# Patient Record
Sex: Male | Born: 1970 | Race: White | Hispanic: No | Marital: Married | Smoking: Never smoker
Health system: Southern US, Community
[De-identification: ages and names within clinical notes are randomized; demographics above are authoritative.]

## PROBLEM LIST (undated history)

## (undated) DIAGNOSIS — T7840XA Allergy, unspecified, initial encounter: Secondary | ICD-10-CM

## (undated) HISTORY — PX: VASECTOMY: SHX75

## (undated) HISTORY — DX: Allergy, unspecified, initial encounter: T78.40XA

---

## 2006-02-04 ENCOUNTER — Ambulatory Visit: Payer: Self-pay | Admitting: Cardiology

## 2009-09-10 ENCOUNTER — Emergency Department (HOSPITAL_COMMUNITY): Admission: EM | Admit: 2009-09-10 | Discharge: 2009-09-10 | Payer: Self-pay | Admitting: Emergency Medicine

## 2009-09-12 ENCOUNTER — Encounter: Payer: Self-pay | Admitting: Cardiology

## 2009-09-21 ENCOUNTER — Encounter: Payer: Self-pay | Admitting: Cardiology

## 2009-10-10 DIAGNOSIS — R079 Chest pain, unspecified: Secondary | ICD-10-CM | POA: Insufficient documentation

## 2009-10-15 ENCOUNTER — Ambulatory Visit: Payer: Self-pay | Admitting: Cardiology

## 2009-10-15 ENCOUNTER — Ambulatory Visit: Payer: Self-pay

## 2009-10-18 ENCOUNTER — Telehealth: Payer: Self-pay | Admitting: Cardiology

## 2009-10-18 LAB — CONVERTED CEMR LAB
Eosinophils Relative: 1.3 % (ref 0.0–5.0)
Hemoglobin: 14.7 g/dL (ref 13.0–17.0)
Lymphocytes Relative: 23 % (ref 12.0–46.0)
MCV: 93.1 fL (ref 78.0–100.0)
Monocytes Absolute: 0.6 10*3/uL (ref 0.1–1.0)
Monocytes Relative: 8.3 % (ref 3.0–12.0)
Neutro Abs: 5 10*3/uL (ref 1.4–7.7)
Platelets: 239 10*3/uL (ref 150.0–400.0)
WBC: 7.5 10*3/uL (ref 4.5–10.5)

## 2010-06-24 NOTE — Letter (Signed)
Summary: [employer] Office Note  [employer] Office Note   Imported By: Roderic Ovens 11/05/2009 16:38:30  _____________________________________________________________________  External Attachment:    Type:   Image     Comment:   External Document

## 2010-06-24 NOTE — Assessment & Plan Note (Signed)
Summary: NP6/ CHESTPAIN/ PT HAS GOLDEN RULE/ GD    Other Orders: TLB-CBC Platelet - w/Differential (85025-CBCD) TLB-TSH (Thyroid Stimulating Hormone) (84443-TSH)

## 2010-06-24 NOTE — Progress Notes (Signed)
Summary: Lab results  Phone Note Call from Patient Call back at [phone]   Caller: Patient Summary of Call: lab results Initial call taken by: Judie Grieve,  Oct 18, 2009 11:26 AM  Follow-up for Phone Call        Phone Call Completed Follow-up by: Scherrie Bateman, LPN,  Oct 18, 2009 11:59 AM     Appended Document: Lab results agree with diet and exercise.

## 2010-08-12 LAB — BASIC METABOLIC PANEL
CO2: 26 mEq/L (ref 19–32)
Calcium: 9.3 mg/dL (ref 8.4–10.5)
Chloride: 100 mEq/L (ref 96–112)
Creatinine, Ser: 1.02 mg/dL (ref 0.4–1.5)
GFR calc non Af Amer: >60 mL/min (ref >60)
Potassium: 3.6 mEq/L (ref 3.5–5.1)
Sodium: 135 mEq/L (ref 135–145)

## 2010-08-12 LAB — DIFFERENTIAL
Basophils Absolute: 0 K/uL (ref 0.0–0.1)
Basophils Relative: 0 % (ref 0–1)
Eosinophils Absolute: 0.1 K/uL (ref 0.0–0.7)
Eosinophils Relative: 1 % (ref 0–5)
Lymphocytes Relative: 15 % (ref 12–46)
Lymphs Abs: 1.4 K/uL (ref 0.7–4.0)
Monocytes Absolute: 0.6 K/uL (ref 0.1–1.0)
Monocytes Relative: 7 % (ref 3–12)
Neutro Abs: 7 K/uL (ref 1.7–7.7)
Neutrophils Relative %: 77 % (ref 43–77)

## 2010-08-12 LAB — POCT CARDIAC MARKERS
CKMB, poc: 1.1 ng/mL (ref 1.0–8.0)
Myoglobin, poc: 86.2 ng/mL (ref 12–200)
Troponin i, poc: <0.05 ng/mL (ref 0.00–0.09)

## 2010-08-12 LAB — CBC
MCHC: 35.4 g/dL (ref 30.0–36.0)
MCV: 88.8 fL (ref 78.0–100.0)
RBC: 4.59 MIL/uL (ref 4.22–5.81)
WBC: 9 10*3/uL (ref 4.0–10.5)

## 2011-09-17 IMAGING — CR DG CHEST 1V PORT
1 series · 1 of 1 positions shown · non-contrast
Comparison: None.

CLINICAL DATA: Left chest pain.  Shortness of breath.

PORTABLE CHEST - 1 VIEW

[view not recorded]
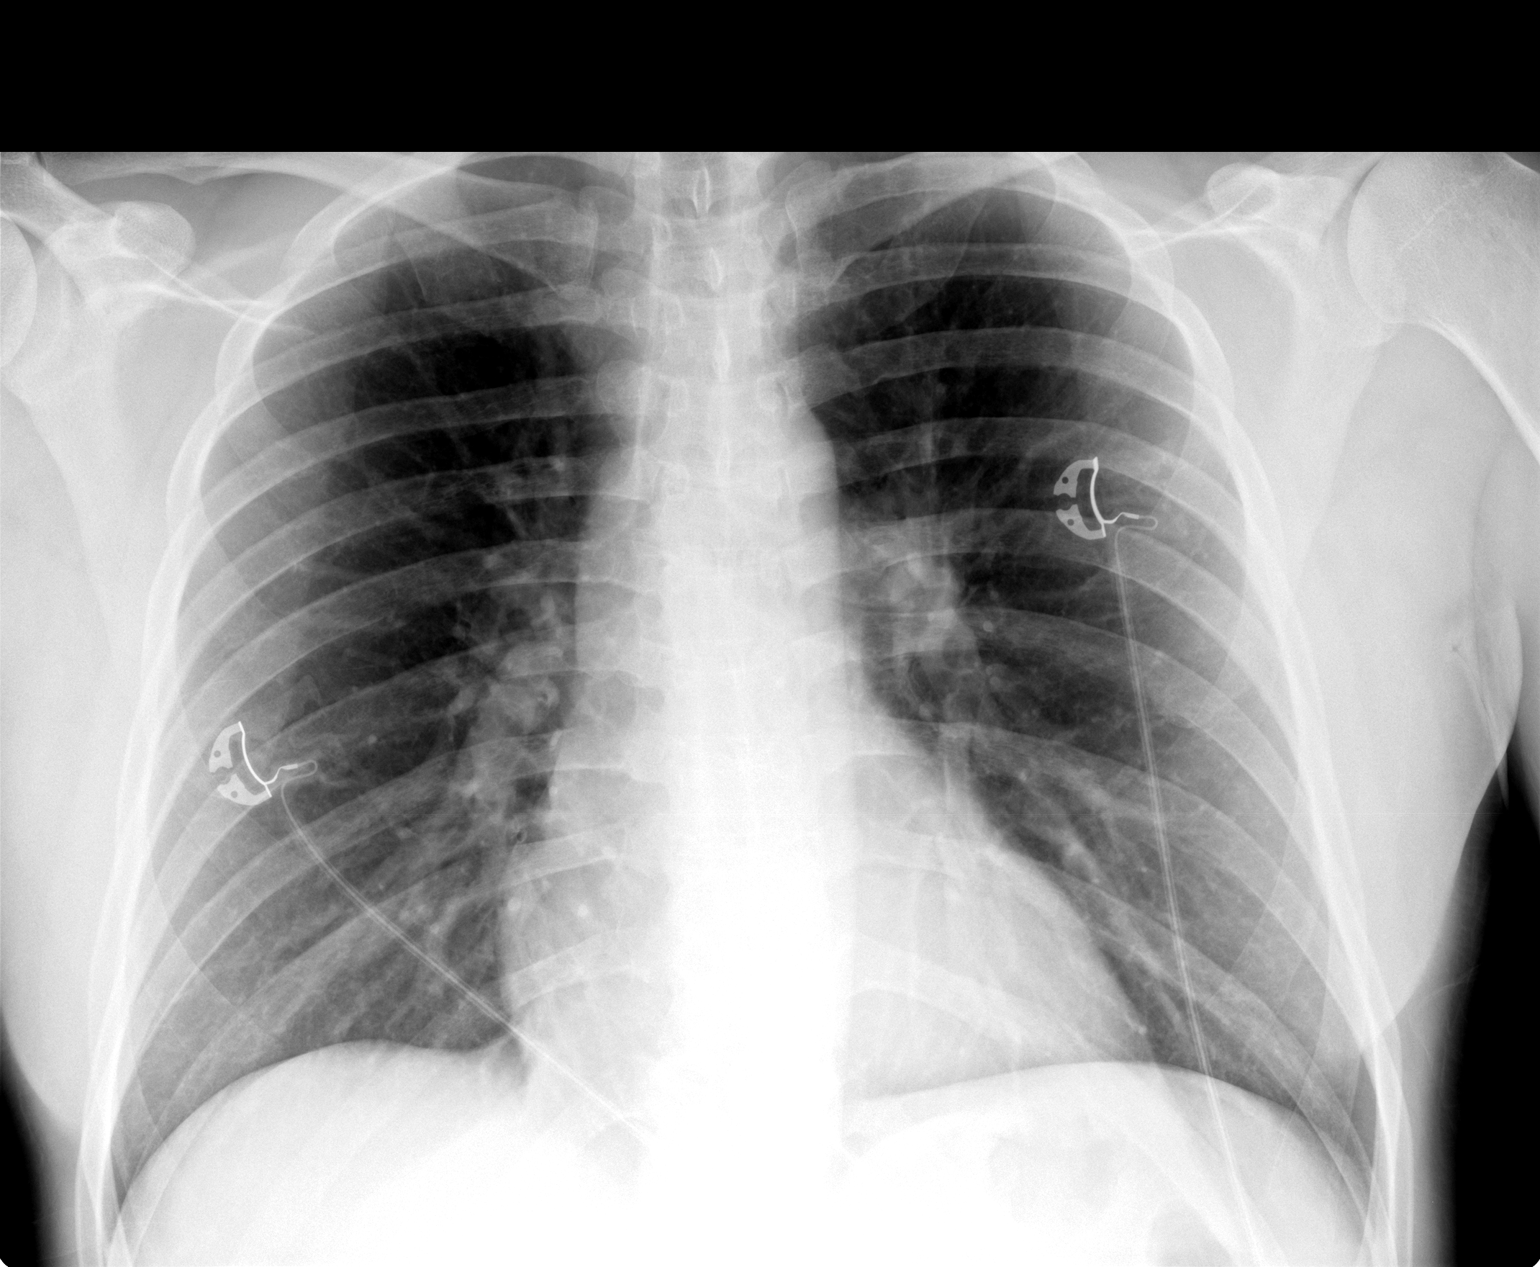

[1 of 1 positions shown; findings below may reference images not displayed]

FINDINGS: Cardiac and mediastinal contours appear normal.

The lungs appear clear.

No pleural effusion is identified.
IMPRESSION: No significant abnormality identified.

## 2014-04-28 ENCOUNTER — Ambulatory Visit (INDEPENDENT_AMBULATORY_CARE_PROVIDER_SITE_OTHER): Payer: BC Managed Care – PPO | Admitting: Family Medicine

## 2014-04-28 VITALS — BP 126/68 | HR 49 | Temp 98.2°F | Resp 19 | Ht 69.0 in | Wt 210.0 lb

## 2014-04-28 DIAGNOSIS — M545 Low back pain: Secondary | ICD-10-CM

## 2014-04-28 DIAGNOSIS — S39011A Strain of muscle, fascia and tendon of abdomen, initial encounter: Secondary | ICD-10-CM

## 2014-04-28 LAB — POCT UA - MICROSCOPIC ONLY
BACTERIA, U MICROSCOPIC: NEGATIVE
Casts, Ur, LPF, POC: NEGATIVE
Crystals, Ur, HPF, POC: NEGATIVE
Mucus, UA: NEGATIVE
RBC, urine, microscopic: NEGATIVE
Yeast, UA: NEGATIVE

## 2014-04-28 LAB — POCT URINALYSIS DIPSTICK
BILIRUBIN UA: NEGATIVE
Glucose, UA: 100
KETONES UA: NEGATIVE
LEUKOCYTES UA: NEGATIVE
Nitrite, UA: POSITIVE
PH UA: 7.5
Protein, UA: NEGATIVE
RBC UA: NEGATIVE
SPEC GRAV UA: 1.015
UROBILINOGEN UA: 1

## 2014-04-28 NOTE — Progress Notes (Addendum)
Subjective:    Patient ID: Johnny Love, male    DOB: 09-01-1970, 43 y.o.   MRN: [id number]  HPI Chief Complaint  Patient presents with   Flank Pain    side pain during the day, radiated to genital area   Back Pain    when sleeping   This chart was scribed for Nilda SimmerKristi Smith, MD by Andrew Auaven Small, ED Scribe. This patient was seen in room 3 and the patient's care was started at 11:16 AM.  HPI Comments: Johnny BucksRichard J Fitzpatrick is a 43 y.o. male who presents to the [facility] complaining of worsening flank pain that began 2 weeks ago. Pt reports worsening pain with sleep and movement, such as turning and bending, that radiates to genitals and mid back without numbness and tingling to genital or extremities .He rates pain in the morning 7/10, 4-5/10 throughout the day and a very mild 1-2/10 at this time.  He reports for the last 2 nights pain has woken him up. Pt has taken AZO with improvement in pain.  Pt has taken 3 ibuprofen at once for the past 2 days. Pt  recently fell at the beginning of this week, missing a step when taking his daughter to the bus two weeks ago. Pt denies falling hard.  He denies chills, sweats, dysuria, urgency, frequency, hematuria. Pt denies h/o prostate infection. He denies frequent medication use. He denies hx of kidney stone.  Married without new sexual partners.  Past Medical History  Diagnosis Date   Allergy    Allergies  Allergen Reactions   Sulfa Antibiotics     Sulfa drugs   Prior to Admission medications   Not on File   Review of Systems  Constitutional: Negative for fever, chills and diaphoresis.  Respiratory: Negative for cough and shortness of breath.   Gastrointestinal: Positive for nausea. Negative for vomiting, abdominal pain, diarrhea, constipation, blood in stool, anal bleeding and rectal pain.  Genitourinary: Positive for flank pain and testicular pain. Negative for dysuria, urgency, frequency, hematuria, decreased urine  volume, discharge, penile swelling, scrotal swelling, difficulty urinating and penile pain.  Musculoskeletal: Positive for myalgias and back pain. Negative for gait problem.  Skin: Negative for rash.  Neurological: Negative for weakness and numbness.   Objective:   Physical Exam  Constitutional: He is oriented to person, place, and time. He appears well-developed and well-nourished. No distress.  HENT:  Head: Normocephalic and atraumatic.  Eyes: Conjunctivae and EOM are normal.  Neck: Neck supple.  Cardiovascular: Normal rate, regular rhythm and normal heart sounds.   No murmur heard. Pulmonary/Chest: Effort normal and breath sounds normal. No respiratory distress. He has no wheezes. He has no rales.  Abdominal: Soft. Bowel sounds are normal. He exhibits no distension. There is no tenderness. There is no rebound and no guarding. Hernia confirmed negative in the right inguinal area and confirmed negative in the left inguinal area.  Genitourinary: Penis normal. Right testis shows no mass, no swelling and no tenderness. Right testis is descended. Left testis shows tenderness. Left testis shows no mass and no swelling. Circumcised.  Mild tenderness to epididymis of left testicle right is normal.   Musculoskeletal: Normal range of motion.       Lumbar back: He exhibits pain. He exhibits normal range of motion, no tenderness, no bony tenderness, no swelling, no edema, no spasm and normal pulse.  Full ROM of lumbar spine with minimal pain reproduced. Straight leg negative. Heel toe normal. Marching normal.  5/5 strength   Lymphadenopathy:       Right: No inguinal adenopathy present.       Left: No inguinal adenopathy present.  Neurological: He is alert and oriented to person, place, and time.  Skin: Skin is warm and dry. No rash noted. He is not diaphoretic.  Psychiatric: He has a normal mood and affect. His behavior is normal.  Nursing note and vitals reviewed.  Results for orders placed or  performed in visit on 04/28/14  POCT UA - Microscopic Only  Result Value Ref Range   WBC, Ur, HPF, POC 0-2    RBC, urine, microscopic neg    Bacteria, U Microscopic neg    Mucus, UA neg    Epithelial cells, urine per micros 1-3    Crystals, Ur, HPF, POC neg    Casts, Ur, LPF, POC neg    Yeast, UA neg   POCT urinalysis dipstick  Result Value Ref Range   Color, UA orange    Clarity, UA clear    Glucose, UA 100    Bilirubin, UA neg    Ketones, UA neg    Spec Grav, UA 1.015    Blood, UA neg    pH, UA 7.5    Protein, UA neg    Urobilinogen, UA 1.0    Nitrite, UA positive    Leukocytes, UA Negative         Assessment & Plan:  Low back pain without sciatica, unspecified back pain laterality - Plan: POCT UA - Microscopic Only, POCT urinalysis dipstick, Urine culture   1. Lower back pain L:  New.  With radiation into L testicle.  Pain reproduced with change in position.  Some mild related urinary symptoms thus will send urine culture. Treat with ibuprofen 600mg  tid scheduled for one week and then PRN.  Home exercise program reviewed. Recommend rest, stretching, and avoid heavy lifting for two weeks. RTC for worsening or changing symptoms.  Pt declined xray and agreeable; if pain persists, return for lumbar spine films.    No orders of the defined types were placed in this encounter.    I personally performed the services described in this documentation, which was scribed in my presence. The recorded information has been reviewed and considered.  Nilda SimmerKristi Smith, M.D.  [facility]   [place] [address] [place], [place]  [id number] [phone] phone [phone] fax

## 2014-04-29 LAB — URINE CULTURE
Colony Count: NO GROWTH
Organism ID, Bacteria: NO GROWTH

## 2017-08-31 DIAGNOSIS — B349 Viral infection, unspecified: Secondary | ICD-10-CM | POA: Diagnosis not present

## 2017-09-23 DIAGNOSIS — L237 Allergic contact dermatitis due to plants, except food: Secondary | ICD-10-CM | POA: Diagnosis not present

## 2018-01-28 DIAGNOSIS — M25561 Pain in right knee: Secondary | ICD-10-CM | POA: Diagnosis not present

## 2018-06-23 DIAGNOSIS — J101 Influenza due to other identified influenza virus with other respiratory manifestations: Secondary | ICD-10-CM | POA: Diagnosis not present

## 2020-01-01 DIAGNOSIS — B349 Viral infection, unspecified: Secondary | ICD-10-CM | POA: Diagnosis not present

## 2020-01-01 DIAGNOSIS — L237 Allergic contact dermatitis due to plants, except food: Secondary | ICD-10-CM | POA: Diagnosis not present

## 2020-01-01 DIAGNOSIS — Z0000 Encounter for general adult medical examination without abnormal findings: Secondary | ICD-10-CM | POA: Diagnosis not present

## 2020-01-03 DIAGNOSIS — Z0001 Encounter for general adult medical examination with abnormal findings: Secondary | ICD-10-CM | POA: Diagnosis not present

## 2021-12-19 DIAGNOSIS — M25512 Pain in left shoulder: Secondary | ICD-10-CM | POA: Diagnosis not present

## 2021-12-19 DIAGNOSIS — M25561 Pain in right knee: Secondary | ICD-10-CM | POA: Diagnosis not present

## 2022-01-21 DIAGNOSIS — R7301 Impaired fasting glucose: Secondary | ICD-10-CM | POA: Diagnosis not present

## 2022-01-21 DIAGNOSIS — Z125 Encounter for screening for malignant neoplasm of prostate: Secondary | ICD-10-CM | POA: Diagnosis not present

## 2022-01-21 DIAGNOSIS — E782 Mixed hyperlipidemia: Secondary | ICD-10-CM | POA: Diagnosis not present

## 2022-01-21 DIAGNOSIS — Z0000 Encounter for general adult medical examination without abnormal findings: Secondary | ICD-10-CM | POA: Diagnosis not present

## 2022-01-28 DIAGNOSIS — Z0001 Encounter for general adult medical examination with abnormal findings: Secondary | ICD-10-CM | POA: Diagnosis not present

## 2022-02-17 ENCOUNTER — Ambulatory Visit (HOSPITAL_COMMUNITY): Payer: Self-pay | Attending: Orthopaedic Surgery | Admitting: Occupational Therapy

## 2022-02-17 DIAGNOSIS — M25512 Pain in left shoulder: Secondary | ICD-10-CM | POA: Insufficient documentation

## 2022-02-17 DIAGNOSIS — R29898 Other symptoms and signs involving the musculoskeletal system: Secondary | ICD-10-CM | POA: Insufficient documentation

## 2022-02-17 NOTE — Patient Instructions (Signed)

## 2022-02-17 NOTE — Therapy (Unsigned)
OUTPATIENT OCCUPATIONAL THERAPY ORTHO EVALUATION  Patient Name: Johnny Love MRN: [id number] DOB:[birth year], 51 y.o., male Today's Date: 02/18/2022  PCP: Catalina Pizza, MD REFERRING PROVIDER: Bjorn Pippin, MD   OT End of Session - 02/18/22 1305     Visit Number 1    Number of Visits 5    Date for OT Re-Evaluation 03/17/22    OT Start Time 1517    OT Stop Time 1557    OT Time Calculation (min) 40 min    Activity Tolerance Patient tolerated treatment well    Behavior During Therapy [facility] for tasks assessed/performed             Past Medical History:  Diagnosis Date   Allergy    Past Surgical History:  Procedure Laterality Date   VASECTOMY     Patient Active Problem List   Diagnosis Date Noted   CHEST PAIN-UNSPECIFIED 10/10/2009    ONSET DATE: 05/2021  REFERRING DIAG: L shoulder Tendonitis  THERAPY DIAG:  Acute pain of left shoulder  Other symptoms and signs involving the musculoskeletal system  Rationale for Evaluation and Treatment Rehabilitation  SUBJECTIVE:   SUBJECTIVE STATEMENT: "I have the most pain with exercising and movements out and back" Pt accompanied by: self  PERTINENT HISTORY: Pt reports that he began experiencing intermittent pain with exercises starting at the beginning of the year. Since then, pt reports that pain has worsened and become more frequent, however it is limited to lifting and exercising and does not happen all the time. He was referred to an Orthopedic MD who is working him up for shoulder tendonitis. Pt continues to report independence with all ADL's and IADL's, including working as a Programmer, multimedia and exercising daily.   PRECAUTIONS: None  WEIGHT BEARING RESTRICTIONS No  PAIN:  Are you having pain? No  FALLS: Has patient fallen in last 6 months? No  LIVING ENVIRONMENT: Lives with: lives with their spouse Lives in: House/apartment  PLOF: Independent  PATIENT GOALS "To try to eliminate the pain"  OBJECTIVE:    HAND DOMINANCE: Left  ADLs: Overall ADLs: Independent, works as a Psychologist, occupational, drives, likes to go to [employer], hike, kayaking Pt is having difficulty with lifting, reaching to the side and back, as well as rotational movements required in many IADL's   FUNCTIONAL OUTCOME MEASURES: FOTO: 91.52  UPPER EXTREMITY ROM     Active ROM Left eval  Shoulder flexion 162  Shoulder abduction 160  Shoulder internal rotation WFL  Shoulder external rotation WFL  (Blank rows = not tested)  UPPER EXTREMITY MMT:     MMT Left eval  Shoulder flexion 5/5  Shoulder abduction 5/5  Shoulder adduction 4+/5  Shoulder extension 4+/5  Shoulder internal rotation 5/5  Shoulder external rotation 4+/5  Elbow flexion 5/5  Elbow extension 4+/5  (Blank rows = not tested)  HAND FUNCTION: Grip strength: Right: 120 lbs; Left: 122 lbs  SENSATION: WFL  EDEMA: No swelling   OBSERVATIONS: Pt has minimal fascial restrictions in the bicep and axillary region, as well as muscle tightness along the trapezius, scapular region, and pectoralis.    TODAY'S TREATMENT:  Evaluation only   PATIENT EDUCATION: Education details: Hospital doctor ([place]) Person educated: Patient Education method: Explanation, Demonstration, and Handouts Education comprehension: verbalized understanding and returned demonstration   HOME EXERCISE PROGRAM: 02/17/22: Scapula Strengthening ([place])  GOALS: Goals reviewed with patient? Yes  SHORT TERM GOALS: Target date:  03/20/22     Pt will be provided with  and educated on HEP to improve mobility in LUE required for ADL completion.  Goal status: INITIAL  2.  Pt will decrease pain in LUE to 1/10 or less in order to complete exercises in the gym with no rest breaks due to pain.  Goal status: INITIAL  3.  Pt will decrease LUE fascial restrictions to trace amounts or less to improve mobility required for overhead reaching tasks.  Goal status: INITIAL  4.  Pt will  increase A/ROM of LUE by 8 degrees to improve ability to reach overhead and behind back during dressing and bathing tasks.  Goal status: INITIAL  5.  Pt will increase strength in LUE to 5/5 to improve ability to perform heavy lifting tasks required with cooking and cleaning.  Goal status: INITIAL     ASSESSMENT:  CLINICAL IMPRESSION: Patient is a 51 y.o. M who was seen today for occupational therapy evaluation for Left Shoulder pain. Pt reports that his pain is intermittent and mainly present with lifting heavier items, exercising, or even certain rotational movements. At this time, pt is independent with ADL's, however he notes increased difficulties with many IADL tasks due to pain. Pt will benefit from skilled OT services to address LUE pain, ROM, and strength, in order to improve independence and safety with IADL's.    PERFORMANCE DEFICITS in functional skills including ADLs, IADLs, ROM, strength, pain, fascial restrictions, body mechanics, and UE functional use.  IMPAIRMENTS are limiting patient from ADLs, IADLs, work, and leisure.   COMORBIDITIES has no other co-morbidities that affects occupational performance. Patient will benefit from skilled OT to address above impairments and improve overall function.  MODIFICATION OR ASSISTANCE TO COMPLETE EVALUATION: No modification of tasks or assist necessary to complete an evaluation.  OT OCCUPATIONAL PROFILE AND HISTORY: Problem focused assessment: Including review of records relating to presenting problem.  CLINICAL DECISION MAKING: LOW - limited treatment options, no task modification necessary  REHAB POTENTIAL: Excellent  EVALUATION COMPLEXITY: Low      PLAN: OT FREQUENCY: 1x/week  OT DURATION: 4 weeks  PLANNED INTERVENTIONS: self care/ADL training, therapeutic exercise, therapeutic activity, manual therapy, passive range of motion, ultrasound, moist heat, cryotherapy, patient/family education, and coping strategies  training  RECOMMENDED OTHER SERVICES: N/A  CONSULTED AND AGREED WITH PLAN OF CARE: Patient  PLAN FOR NEXT SESSION: Continue Scap Strengthening, start lighter shoulder strengthening, shoulder stability exercises.    Johnny Love Yolanda Bonine, OTR/L [facility] [phone]  02/18/2022, 1:08 PM

## 2022-02-18 ENCOUNTER — Encounter (HOSPITAL_COMMUNITY): Payer: Self-pay | Admitting: Occupational Therapy

## 2022-03-14 DIAGNOSIS — Z1211 Encounter for screening for malignant neoplasm of colon: Secondary | ICD-10-CM | POA: Diagnosis not present

## 2023-01-15 DIAGNOSIS — T1490XA Injury, unspecified, initial encounter: Secondary | ICD-10-CM | POA: Diagnosis not present

## 2023-01-15 DIAGNOSIS — M542 Cervicalgia: Secondary | ICD-10-CM | POA: Diagnosis not present
